# Patient Record
Sex: Female | Born: 1975 | Hispanic: Yes
Health system: Southern US, Community
[De-identification: ages and names within clinical notes are randomized; demographics above are authoritative.]

---

## 2010-01-13 DEATH — deceased

## 2013-10-14 ENCOUNTER — Emergency Department: Payer: Self-pay | Admitting: Emergency Medicine

## 2013-10-14 LAB — URINALYSIS, COMPLETE
Glucose,UR: NEGATIVE mg/dL (ref 0–75)
Ketone: NEGATIVE
Nitrite: NEGATIVE
Ph: 6 (ref 4.5–8.0)
RBC,UR: 3 /HPF (ref 0–5)
WBC UR: 59 /HPF (ref 0–5)

## 2019-11-06 ENCOUNTER — Other Ambulatory Visit: Payer: Self-pay

## 2019-11-06 DIAGNOSIS — Z20822 Contact with and (suspected) exposure to covid-19: Secondary | ICD-10-CM

## 2019-11-08 LAB — NOVEL CORONAVIRUS, NAA: SARS-CoV-2, NAA: NOT DETECTED

## 2020-03-10 ENCOUNTER — Ambulatory Visit: Payer: Self-pay | Attending: Internal Medicine

## 2020-03-10 DIAGNOSIS — Z23 Encounter for immunization: Secondary | ICD-10-CM

## 2020-03-10 NOTE — Progress Notes (Signed)
   Covid-19 Vaccination Clinic  Name:  Susan Ellis    MRN: [id number] DOB: [birth year]  03/10/2020  [name] was observed post Covid-19 immunization for 15 minutes without incident. She was provided with Vaccine Information Sheet and instruction to access the V-Safe system.   [name] was instructed to call 911 with any severe reactions post vaccine: Marland Kitchen Difficulty breathing  . Swelling of face and throat  . A fast heartbeat  . A bad rash all over body  . Dizziness and weakness   Immunizations Administered    Name Date Dose VIS Date Route   Pfizer COVID-19 Vaccine 03/10/2020 10:08 AM 0.3 mL 11/23/2019 Intramuscular   Manufacturer: [employer], [employer]   Lot: [id number]   NDC: [address]-7

## 2020-03-31 ENCOUNTER — Ambulatory Visit: Payer: Self-pay | Attending: Internal Medicine

## 2020-03-31 DIAGNOSIS — Z23 Encounter for immunization: Secondary | ICD-10-CM

## 2020-03-31 NOTE — Progress Notes (Signed)
   Covid-19 Vaccination Clinic  Name:  Clemma Johnsen    MRN: [id number] DOB: 07-12-1976  03/31/2020  Ms. Hiser was observed post Covid-19 immunization for 15 minutes without incident. She was provided with Vaccine Information Sheet and instruction to access the V-Safe system.   Ms. [place]bay was instructed to call 911 with any severe reactions post vaccine: Marland Kitchen Difficulty breathing  . Swelling of face and throat  . A fast heartbeat  . A bad rash all over body  . Dizziness and weakness   Immunizations Administered    Name Date Dose VIS Date Route   Pfizer COVID-19 Vaccine 03/31/2020 10:07 AM 0.3 mL 02/06/2019 Intramuscular   Manufacturer: [employer], [employer]   Lot: [id number]   NDC: [address]-8

## 2022-03-30 ENCOUNTER — Other Ambulatory Visit: Payer: Self-pay

## 2022-03-30 ENCOUNTER — Emergency Department: Payer: Self-pay

## 2022-03-30 ENCOUNTER — Emergency Department
Admission: EM | Admit: 2022-03-30 | Discharge: 2022-03-31 | Disposition: A | Discharge status: Home / Self Care | Payer: Self-pay | Attending: Emergency Medicine | Admitting: Emergency Medicine

## 2022-03-30 DIAGNOSIS — H81399 Other peripheral vertigo, unspecified ear: Secondary | ICD-10-CM | POA: Insufficient documentation

## 2022-03-30 LAB — CBC WITH DIFFERENTIAL/PLATELET
Abs Immature Granulocytes: 0.04 10*3/uL (ref 0.00–0.07)
Basophils Absolute: 0 10*3/uL (ref 0.0–0.1)
Basophils Relative: 0 %
Eosinophils Absolute: 0 10*3/uL (ref 0.0–0.5)
Eosinophils Relative: 0 %
HCT: 45.4 % (ref 36.0–46.0)
Hemoglobin: 15.1 g/dL — ABNORMAL HIGH (ref 12.0–15.0)
Immature Granulocytes: 0 %
Lymphocytes Relative: 15 %
Lymphs Abs: 1.6 10*3/uL (ref 0.7–4.0)
MCH: 29.9 pg (ref 26.0–34.0)
MCHC: 33.3 g/dL (ref 30.0–36.0)
MCV: 89.9 fL (ref 80.0–100.0)
Monocytes Absolute: 0.4 10*3/uL (ref 0.1–1.0)
Monocytes Relative: 4 %
Neutro Abs: 8.8 10*3/uL — ABNORMAL HIGH (ref 1.7–7.7)
Neutrophils Relative %: 81 %
Platelets: 307 10*3/uL (ref 150–400)
RBC: 5.05 MIL/uL (ref 3.87–5.11)
RDW: 12.6 % (ref 11.5–15.5)
WBC: 10.9 10*3/uL — ABNORMAL HIGH (ref 4.0–10.5)
nRBC: 0 % (ref 0.0–0.2)

## 2022-03-30 LAB — COMPREHENSIVE METABOLIC PANEL
ALT: 33 U/L (ref 0–44)
AST: 31 U/L (ref 15–41)
Albumin: 4.4 g/dL (ref 3.5–5.0)
Alkaline Phosphatase: 116 U/L (ref 38–126)
Anion gap: 11 (ref 5–15)
BUN: 15 mg/dL (ref 6–20)
CO2: 22 mmol/L (ref 22–32)
Calcium: 9.2 mg/dL (ref 8.9–10.3)
Chloride: 104 mmol/L (ref 98–111)
Creatinine, Ser: 0.64 mg/dL (ref 0.44–1.00)
GFR, Estimated: >60 mL/min (ref >60)
Glucose, Bld: 319 mg/dL — ABNORMAL HIGH (ref 70–99)
Potassium: 4.2 mmol/L (ref 3.5–5.1)
Sodium: 137 mmol/L (ref 135–145)
Total Bilirubin: 0.5 mg/dL (ref 0.3–1.2)
Total Protein: 8.2 g/dL — ABNORMAL HIGH (ref 6.5–8.1)

## 2022-03-30 LAB — TROPONIN I (HIGH SENSITIVITY): Troponin I (High Sensitivity): 3 ng/L (ref <18)

## 2022-03-30 MED ORDER — BUTALBITAL-APAP-CAFFEINE 50-325-40 MG PO TABS
2.0000 | ORAL_TABLET | Freq: Once | ORAL | Status: AC
  Administered 2022-03-30: 2 via ORAL
  Filled 2022-03-30: qty 2

## 2022-03-30 MED ORDER — KETOROLAC TROMETHAMINE 30 MG/ML IJ SOLN
30.0000 mg | Freq: Once | INTRAMUSCULAR | Status: AC
  Administered 2022-03-30: 30 mg via INTRAMUSCULAR
  Filled 2022-03-30: qty 1

## 2022-03-30 MED ORDER — MECLIZINE HCL 25 MG PO TABS
25.0000 mg | ORAL_TABLET | Freq: Once | ORAL | Status: AC
  Administered 2022-03-30: 25 mg via ORAL
  Filled 2022-03-30: qty 1

## 2022-03-30 MED ORDER — ONDANSETRON 4 MG PO TBDP
4.0000 mg | ORAL_TABLET | Freq: Once | ORAL | Status: AC
  Administered 2022-03-30: 4 mg via ORAL
  Filled 2022-03-30: qty 1

## 2022-03-30 NOTE — ED Provider Notes (Signed)
? ?[facility] ?Provider Note ? ? ? Event Date/Time  ? First MD Initiated Contact with Patient 03/30/22 1104   ?  (approximate) ? ? ?History  ? ?Dizziness ? ? ?HPI ? ?Susan Ellis is a 46 y.o. female who presents to the ED for evaluation of Dizziness ?  ?No history in the chart to review. ? ?Patient presents to the ED with her sister for evaluation of vertiginous dizziness throughout this morning.  She reports awakening overnight to void, and getting up feeling vertiginous dizziness making ambulation difficult.  She reports 2 episodes of emesis associated with the position change.  She reports feeling much better when she is supine with her eyes closed, but feels terrible with position change.  She reports concurrent global headache.  She has not taken any medications for this.  Denies any falls, trauma or any syncopal episodes. ? ?Physical Exam  ? ?Triage Vital Signs: ?ED Triage Vitals [03/30/22 0953]  ?Enc Vitals Group  ?   BP (!) 174/111  ?   Pulse Rate (!) 101  ?   Resp 20  ?   Temp 98.4 ?F (36.9 ?C)  ?   Temp Source Oral  ?   SpO2 100 %  ?   Weight 150 lb (68 kg)  ?   Height 5\' 1"  (1.549 m)  ?   Head Circumference   ?   Peak Flow   ?   Pain Score 10  ?   Pain Loc   ?   Pain Edu?   ?   Excl. in GC?   ? ? ?Most recent vital signs: ?Vitals:  ? 03/30/22 0953  ?BP: (!) 174/111  ?Pulse: (!) 101  ?Resp: 20  ?Temp: 98.4 ?F (36.9 ?C)  ?SpO2: 100%  ? ? ?General: Awake, no distress.  Supine with her eyes closed, appears somewhat uncomfortable. ?CV:  Good peripheral perfusion.  ?Resp:  Normal effort.  ?Abd:  No distention.  ?MSK:  No deformity noted.  ?Neuro:  No focal deficits appreciated.  Rightward beating nystagmus with horizontal EOM. ?Other:   ? ? ?ED Results / Procedures / Treatments  ? ?Labs ?(all labs ordered are listed, but only abnormal results are displayed) ?Labs Reviewed  ?CBC WITH DIFFERENTIAL/PLATELET - Abnormal; Notable for the following components:  ?    Result Value  ? WBC 10.9  (*)   ? Hemoglobin 15.1 (*)   ? Neutro Abs 8.8 (*)   ? All other components within normal limits  ?COMPREHENSIVE METABOLIC PANEL - Abnormal; Notable for the following components:  ? Glucose, Bld 319 (*)   ? Total Protein 8.2 (*)   ? All other components within normal limits  ?URINALYSIS, ROUTINE W REFLEX MICROSCOPIC  ?TROPONIN I (HIGH SENSITIVITY)  ? ? ?EKG ?Sinus rhythm, rate of 102 bpm.  Normal axis and intervals.  No evidence of acute ischemia. ? ?RADIOLOGY ?CT head reviewed by me without evidence of acute intracranial pathology ? ?Official radiology report(s): ?CT HEAD WO CONTRAST (04/01/22) ? ?Result Date: 03/30/2022 ?CLINICAL DATA:  Dizziness started this morning. EXAM: CT HEAD WITHOUT CONTRAST TECHNIQUE: Contiguous axial images were obtained from the base of the skull through the vertex without intravenous contrast. RADIATION DOSE REDUCTION: This exam was performed according to the departmental dose-optimization program which includes automated exposure control, adjustment of the mA and/or kV according to patient size and/or use of iterative reconstruction technique. COMPARISON:  None. FINDINGS: Brain: No evidence of acute infarction, hemorrhage, hydrocephalus, extra-axial collection or mass  lesion/mass effect. Generalized cerebral atrophy. Vascular: No hyperdense vessel or unexpected calcification. Skull: No osseous abnormality. Sinuses/Orbits: Visualized paranasal sinuses are clear. Visualized mastoid sinuses are clear. Visualized orbits demonstrate no focal abnormality. Other: None IMPRESSION: 1. No acute intracranial abnormality. 2. Generalized cerebral atrophy. Electronically Signed   By: Elige Ko M.D.   On: 03/30/2022 10:24   ? ?PROCEDURES and INTERVENTIONS: ? ?.1-3 Lead EKG Interpretation ?Performed by: Delton Prairie, MD ?Authorized by: Delton Prairie, MD  ? ?  Interpretation: normal   ?  ECG rate:  97 ?  ECG rate assessment: normal   ?  Rhythm: sinus rhythm   ?  Ectopy: none   ?  Conduction: normal    ? ?Medications  ?butalbital-acetaminophen-caffeine (FIORICET) 50-325-40 MG per tablet 2 tablet (2 tablets Oral Given 03/30/22 1143)  ?ketorolac (TORADOL) 30 MG/ML injection 30 mg (30 mg Intramuscular Given 03/30/22 1144)  ?meclizine (ANTIVERT) tablet 25 mg (25 mg Oral Given 03/30/22 1144)  ?ondansetron (ZOFRAN-ODT) disintegrating tablet 4 mg (4 mg Oral Given 03/30/22 1144)  ? ? ? ?IMPRESSION / MDM / ASSESSMENT AND PLAN / ED COURSE  ?I reviewed the triage vital signs and the nursing notes. ? ?46 year old female presents to the ED with peripheral vertigo suitable for outpatient management.  Appears uncomfortable on my initial evaluation, rapidly improving with nonnarcotic analgesia for headache and meclizine for vertigo.  She has a reassuring neurologic examination without evidence of deficits, vascular deficits, trauma.  Exam is suggestive of peripheral vertigo, she is fairly low risk and stroke is quite unlikely.  CT head without evidence of ICH or signs of CVA.  Blood work is benign without evidence of significant anemia, AKI or ACS.  Resolving symptoms and is suitable for outpatient management with ENT follow-up. ? ?Clinical Course as of 03/30/22 1253  ?Tue Mar 30, 2022  ?1131 Discussed plan of care with the patient and her sister.  Shared decision-making yields plan to empirically treat for peripheral vertigo and headache and abstain from MRI at this point pending her response to these medications [DS]  ?1246 Reassessed.  Looks better and reports feeling better. She is keeping her eyes open now and stands and ambulates around the room with minimal symptoms.  Sister also reports that she seems to feel better.  We discussed reassuring nature of her presentation and very unlikely to represent a stroke.  We discussed management at home and return precautions for the ED. [DS]  ?  ?Clinical Course User Index ?[DS] Delton Prairie, MD  ? ? ? ?FINAL CLINICAL IMPRESSION(S) / ED DIAGNOSES  ? ?Final diagnoses:  ?Peripheral  vertigo, unspecified laterality  ? ? ? ?Rx / DC Orders  ? ?ED Discharge Orders   ? ? None  ? ?  ? ? ? ?Note:  This document was prepared using Dragon voice recognition software and may include unintentional dictation errors. ?  ?Delton Prairie, MD ?03/30/22 1254 ? ?

## 2022-03-30 NOTE — Discharge Instructions (Addendum)
Please take Tylenol and ibuprofen/Advil for your pain.  It is safe to take them together, or to alternate them every few hours.  Take up to 1000mg  of Tylenol at a time, up to 4 times per day.  Do not take more than 4000 mg of Tylenol in 24 hours.  For ibuprofen, take 400-600 mg, 4-5 times per day. ? ?Use meclizine as needed for any further vertigo. ? ?Please follow-up with the ENT doctor in the clinic, Dr. .  Call his clinic to be seen. ?

## 2022-03-30 NOTE — ED Triage Notes (Signed)
Pt in with co dizziness that started this am, pt denies any hx of the same. Pt states started vomiting after dizziness started. Has not been able to keep food or fluids down. Still co dizziness.  ?

## 2023-04-27 IMAGING — CT CT HEAD W/O CM
4 series · 17 of 47 positions shown, 19 images · non-contrast
Comparison: None.

CLINICAL DATA: Dizziness started this morning.



[Series 2: head bone · axial · 0.39mm/px · z∈[-57,-9]mm · 4 of 69 slices shown]
[im 7/69  bone]
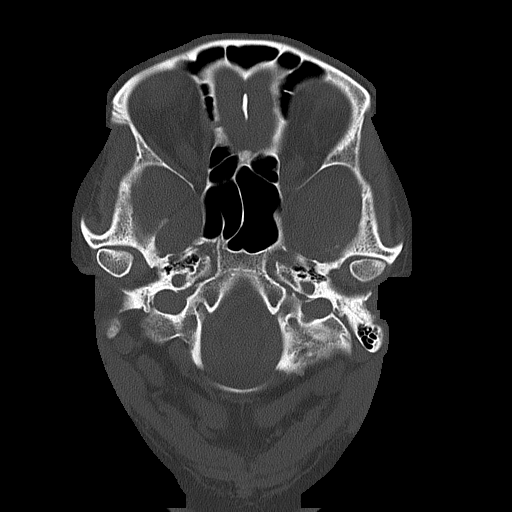
[im 14/69  bone]
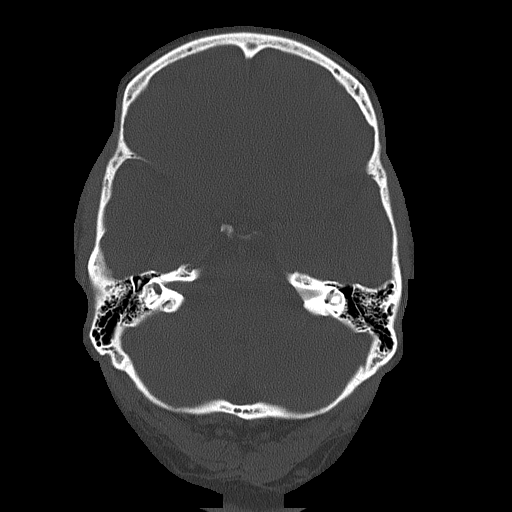
[im 21/69  bone]
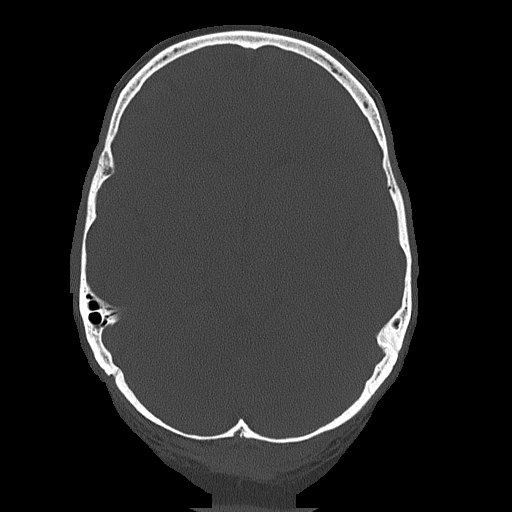
[im 31/69  bone]
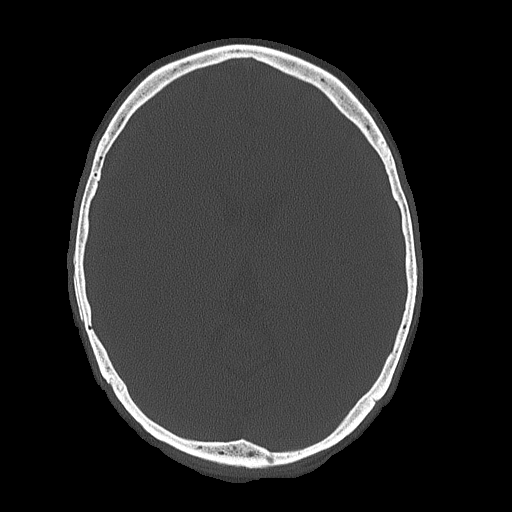

[Series 3: head wo · axial · 0.39mm/px · z∈[-54,+46]mm · 7 of 28 slices shown, 9 images]
[im 4/28  brain]
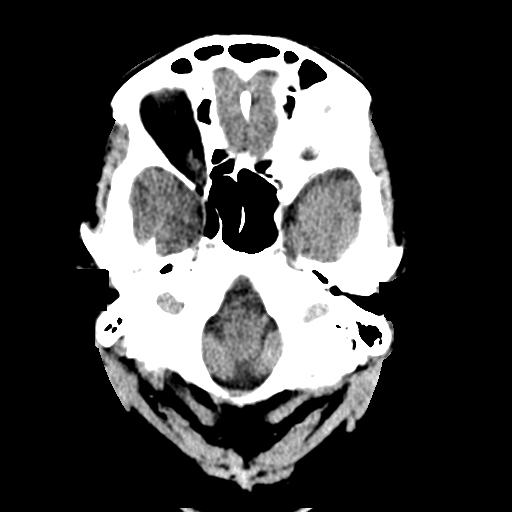
[im 4/28  bone]
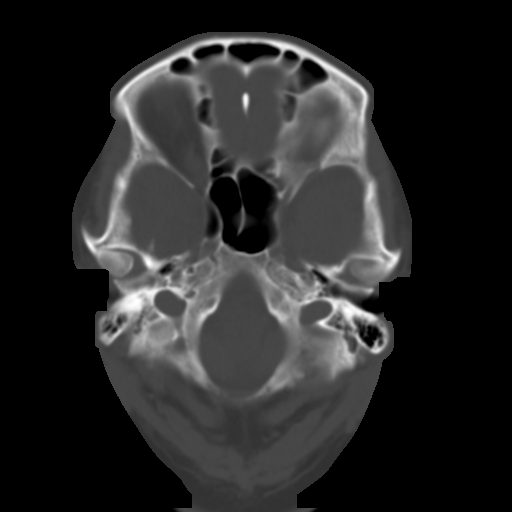
[im 7/28  brain]
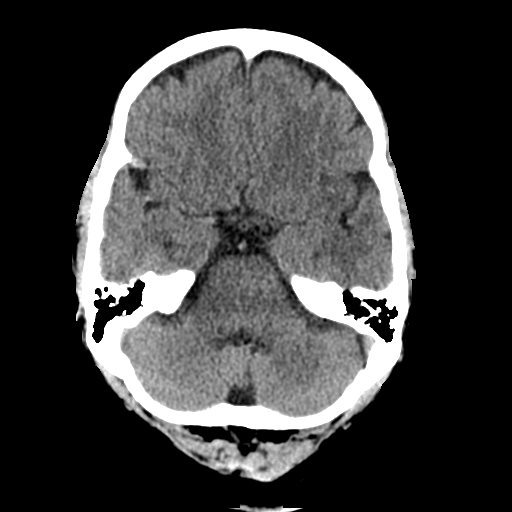
[im 11/28  brain]
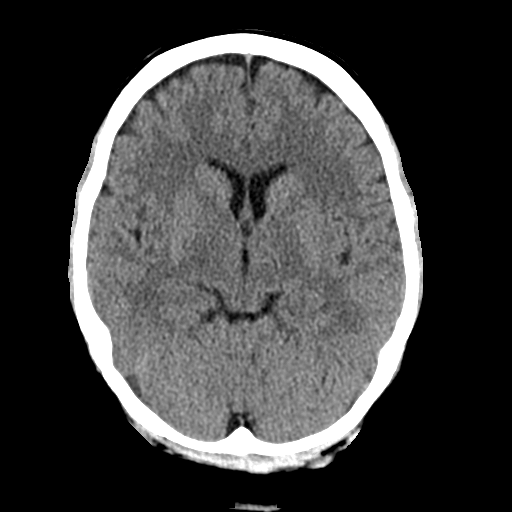
[im 14/28  brain]
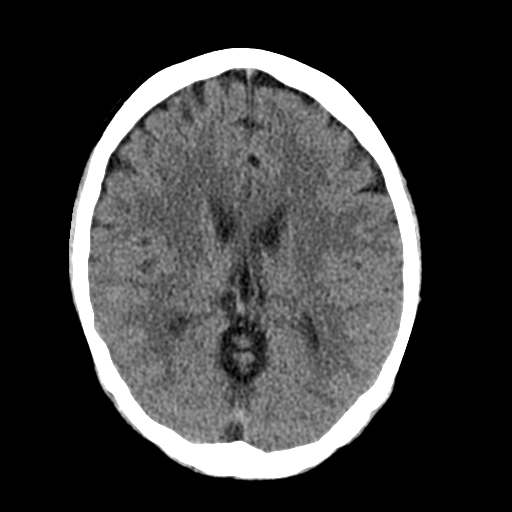
[im 17/28  brain]
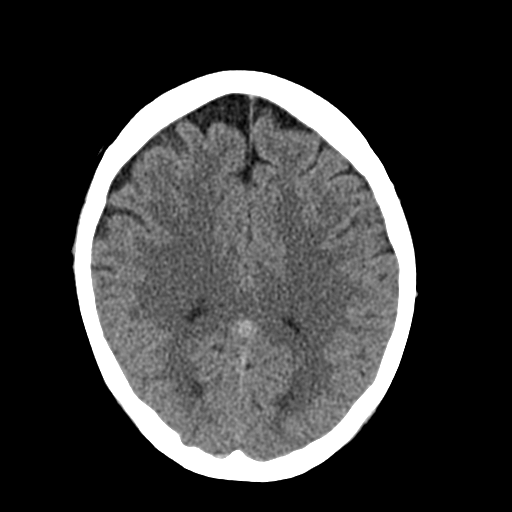
[im 17/28  bone]
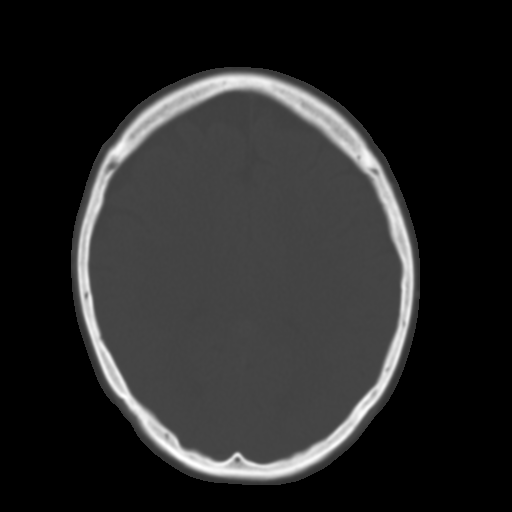
[im 21/28  brain]
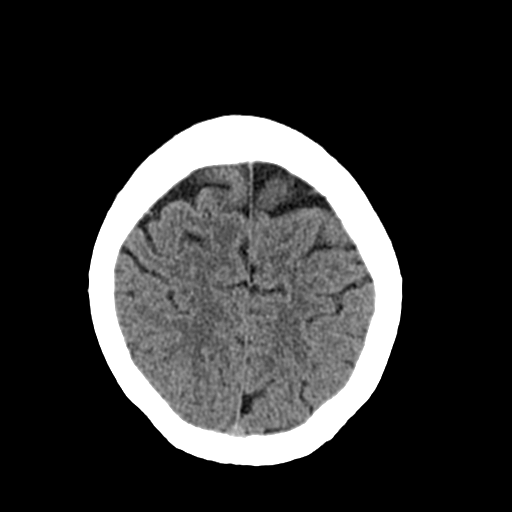
[im 24/28  brain]
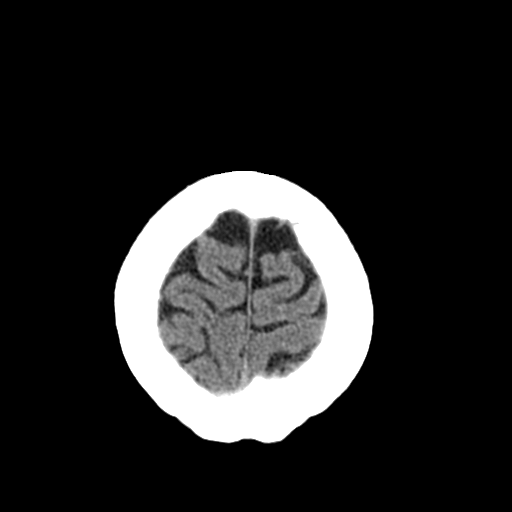

[Series 4: coronal soft tissue · coronal · 0.31mm/px · 3 of 63 slices shown]
[im 21/63  brain]
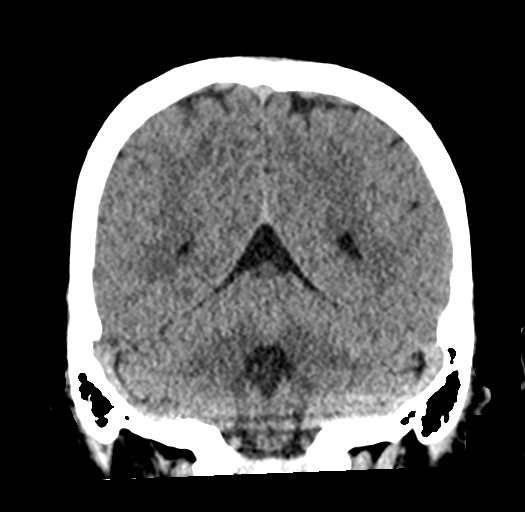
[im 28/63  brain]
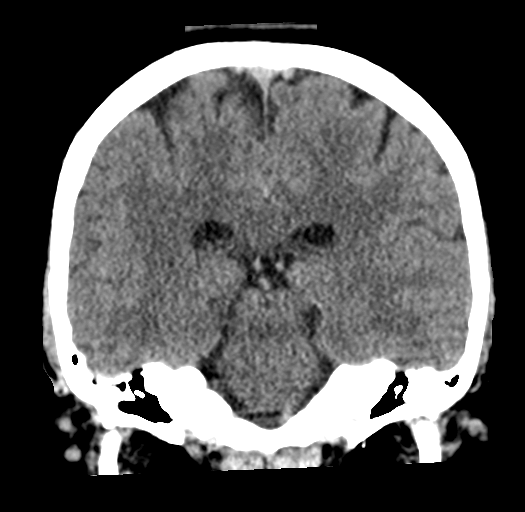
[im 35/63  brain]
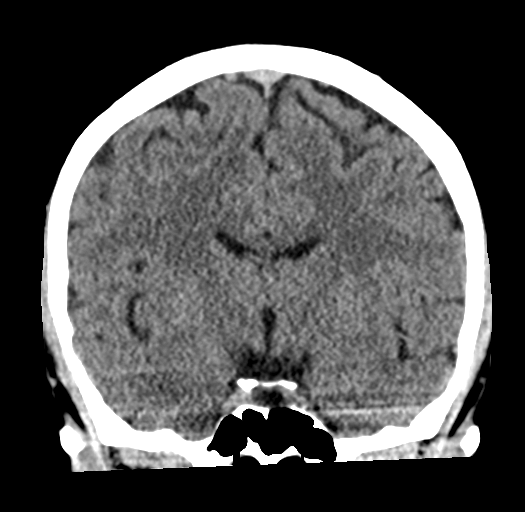

[Series 5: sagittal soft tissue · sagittal · 0.31mm/px · 3 of 55 slices shown]
[im 19/55  brain]
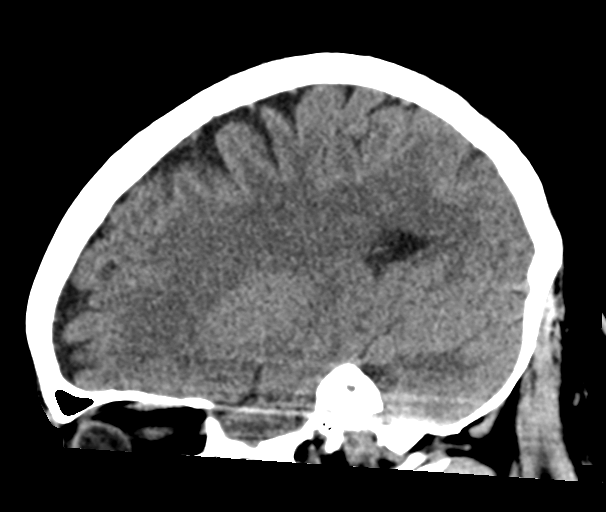
[im 28/55  brain]
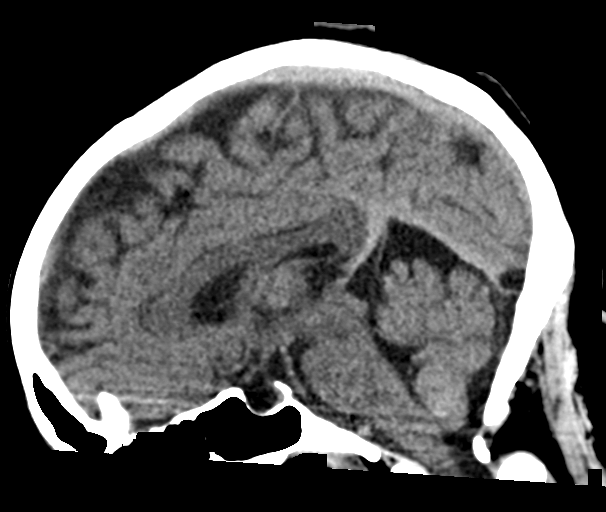
[im 37/55  brain]
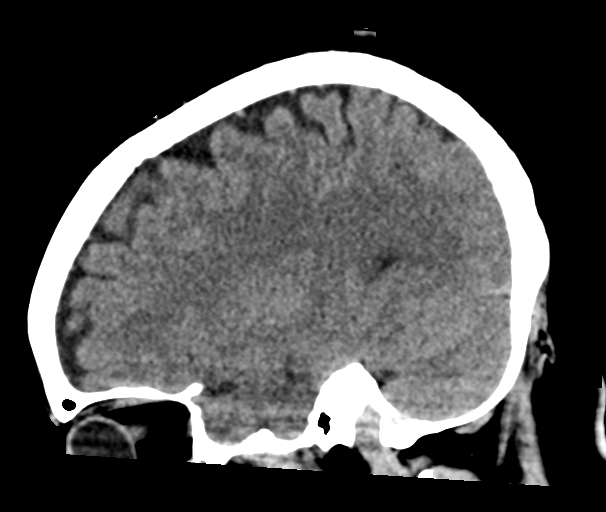

[17 of 47 positions shown; findings below may reference images not displayed]

FINDINGS: Brain: No evidence of acute infarction, hemorrhage, hydrocephalus,
extra-axial collection or mass lesion/mass effect. Generalized
cerebral atrophy.

Vascular: No hyperdense vessel or unexpected calcification.

Skull: No osseous abnormality.

Sinuses/Orbits: Visualized paranasal sinuses are clear. Visualized
mastoid sinuses are clear. Visualized orbits demonstrate no focal
abnormality.

Other: None
IMPRESSION: 1. No acute intracranial abnormality.
2. Generalized cerebral atrophy.

## 2025-01-17 ENCOUNTER — Other Ambulatory Visit: Payer: Self-pay | Admitting: Family Medicine

## 2025-01-17 DIAGNOSIS — Z1231 Encounter for screening mammogram for malignant neoplasm of breast: Secondary | ICD-10-CM
